# Patient Record
Sex: Male | Born: 1985 | Race: White | Hispanic: No | Marital: Single | Smoking: Current every day smoker
Health system: Southern US, Community
[De-identification: ages and names within clinical notes are randomized; demographics above are authoritative.]

## PROBLEM LIST (undated history)

## (undated) DIAGNOSIS — F909 Attention-deficit hyperactivity disorder, unspecified type: Secondary | ICD-10-CM

## (undated) DIAGNOSIS — F209 Schizophrenia, unspecified: Secondary | ICD-10-CM

## (undated) DIAGNOSIS — F988 Other specified behavioral and emotional disorders with onset usually occurring in childhood and adolescence: Secondary | ICD-10-CM

## (undated) DIAGNOSIS — F319 Bipolar disorder, unspecified: Secondary | ICD-10-CM

## (undated) HISTORY — PX: APPENDECTOMY: SHX54

## (undated) HISTORY — PX: OTHER SURGICAL HISTORY: SHX169

---
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
## 2007-12-02 ENCOUNTER — Emergency Department (HOSPITAL_COMMUNITY): Admission: EM | Admit: 2007-12-02 | Discharge: 2007-12-02 | Payer: Self-pay | Admitting: Family Medicine

## 2014-02-25 ENCOUNTER — Encounter (HOSPITAL_COMMUNITY): Payer: Self-pay | Admitting: Emergency Medicine

## 2014-02-25 ENCOUNTER — Emergency Department (HOSPITAL_COMMUNITY)
Admission: EM | Admit: 2014-02-25 | Discharge: 2014-02-26 | Disposition: A | Discharge status: Home / Self Care | Payer: Self-pay | Attending: Emergency Medicine | Admitting: Emergency Medicine

## 2014-02-25 DIAGNOSIS — R4182 Altered mental status, unspecified: Secondary | ICD-10-CM | POA: Insufficient documentation

## 2014-02-25 DIAGNOSIS — F17200 Nicotine dependence, unspecified, uncomplicated: Secondary | ICD-10-CM | POA: Insufficient documentation

## 2014-02-25 DIAGNOSIS — F1190 Opioid use, unspecified, uncomplicated: Secondary | ICD-10-CM

## 2014-02-25 DIAGNOSIS — F1110 Opioid abuse, uncomplicated: Secondary | ICD-10-CM | POA: Insufficient documentation

## 2014-02-25 HISTORY — DX: Other specified behavioral and emotional disorders with onset usually occurring in childhood and adolescence: F98.8

## 2014-02-25 HISTORY — DX: Bipolar disorder, unspecified: F31.9

## 2014-02-25 HISTORY — DX: Schizophrenia, unspecified: F20.9

## 2014-02-25 HISTORY — DX: Attention-deficit hyperactivity disorder, unspecified type: F90.9

## 2014-02-25 LAB — CBC WITH DIFFERENTIAL/PLATELET
BASOS ABS: 0 10*3/uL (ref 0.0–0.1)
Basophils Relative: 0 % (ref 0–1)
Eosinophils Absolute: 0.2 10*3/uL (ref 0.0–0.7)
Eosinophils Relative: 1 % (ref 0–5)
HCT: 49.3 % (ref 39.0–52.0)
Hemoglobin: 16.6 g/dL (ref 13.0–17.0)
LYMPHS ABS: 7.2 10*3/uL — AB (ref 0.7–4.0)
Lymphocytes Relative: 40 % (ref 12–46)
MCH: 31 pg (ref 26.0–34.0)
MCHC: 33.7 g/dL (ref 30.0–36.0)
MCV: 92.1 fL (ref 78.0–100.0)
MONO ABS: 1.3 10*3/uL — AB (ref 0.1–1.0)
MONOS PCT: 7 % (ref 3–12)
Neutro Abs: 9.3 10*3/uL — ABNORMAL HIGH (ref 1.7–7.7)
Neutrophils Relative %: 52 % (ref 43–77)
Platelets: 302 10*3/uL (ref 150–400)
RBC: 5.35 MIL/uL (ref 4.22–5.81)
RDW: 14.5 % (ref 11.5–15.5)
WBC: 18 10*3/uL — AB (ref 4.0–10.5)

## 2014-02-25 LAB — BASIC METABOLIC PANEL
Anion gap: 19 — ABNORMAL HIGH (ref 5–15)
BUN: 14 mg/dL (ref 6–23)
CO2: 19 mEq/L (ref 19–32)
CREATININE: 0.98 mg/dL (ref 0.50–1.35)
Calcium: 9.1 mg/dL (ref 8.4–10.5)
Chloride: 106 mEq/L (ref 96–112)
Glucose, Bld: 195 mg/dL — ABNORMAL HIGH (ref 70–99)
POTASSIUM: 3.5 meq/L — AB (ref 3.7–5.3)
Sodium: 144 mEq/L (ref 137–147)

## 2014-02-25 LAB — ETHANOL: Alcohol, Ethyl (B): <11 mg/dL (ref 0–11)

## 2014-02-25 LAB — SALICYLATE LEVEL: Salicylate Lvl: <2 mg/dL — ABNORMAL LOW (ref 2.8–20.0)

## 2014-02-25 LAB — ACETAMINOPHEN LEVEL: Acetaminophen (Tylenol), Serum: <15 ug/mL (ref 10–30)

## 2014-02-25 LAB — CBG MONITORING, ED: Glucose-Capillary: 183 mg/dL — ABNORMAL HIGH (ref 70–99)

## 2014-02-25 MED ORDER — SODIUM CHLORIDE 0.9 % IV BOLUS (SEPSIS)
1000.0000 mL | Freq: Once | INTRAVENOUS | Status: AC
  Administered 2014-02-25: 1000 mL via INTRAVENOUS

## 2014-02-25 MED ORDER — NALOXONE HCL 0.4 MG/ML IJ SOLN
INTRAMUSCULAR | Status: AC
  Administered 2014-02-25: 0.4 mg via INTRAVENOUS
  Filled 2014-02-25: qty 2

## 2014-02-25 MED ORDER — NALOXONE HCL 0.4 MG/ML IJ SOLN
0.4000 mg | Freq: Once | INTRAMUSCULAR | Status: AC
  Administered 2014-02-25: 0.4 mg via INTRAVENOUS

## 2014-02-25 MED ORDER — NALOXONE HCL 1 MG/ML IJ SOLN
INTRAMUSCULAR | Status: AC
  Filled 2014-02-25: qty 2

## 2014-02-25 NOTE — Discharge Instructions (Signed)
Do not take pain medications for recreation.  Do not take medications that are not yours.  Do not mix drugs and alcohol

## 2014-02-25 NOTE — ED Notes (Addendum)
Pt was found by bystander and brought to ED by bystander. Pt was extricated from car by ED staff. Pt is lethargic a tthis time. Pupils are pinpoint. Pt is admitting to drinking "a few beers.; Pt denies taking any other medications at this time. HR 135, regular.
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
## 2014-02-25 NOTE — ED Notes (Signed)
Pt reports taking loritab 10mg  (5-6 tabs), and oxycodone 10mg  (4-5 tabs), and drinking 8 beers.

## 2014-02-25 NOTE — ED Notes (Signed)
Pt remaining alert and oriented x4, speaking in complete sentences.

## 2014-02-25 NOTE — ED Notes (Signed)
Patient states that he will attempt to give a urine sample at this time.

## 2014-07-07 ENCOUNTER — Emergency Department (HOSPITAL_COMMUNITY): Payer: Self-pay

## 2014-07-07 ENCOUNTER — Encounter (HOSPITAL_COMMUNITY): Payer: Self-pay

## 2014-07-07 ENCOUNTER — Emergency Department (HOSPITAL_COMMUNITY)
Admission: EM | Admit: 2014-07-07 | Discharge: 2014-07-07 | Disposition: A | Discharge status: Home / Self Care | Payer: Self-pay | Attending: Emergency Medicine | Admitting: Emergency Medicine

## 2014-07-07 DIAGNOSIS — R079 Chest pain, unspecified: Secondary | ICD-10-CM

## 2014-07-07 DIAGNOSIS — R0602 Shortness of breath: Secondary | ICD-10-CM

## 2014-07-07 DIAGNOSIS — Z720 Tobacco use: Secondary | ICD-10-CM | POA: Insufficient documentation

## 2014-07-07 DIAGNOSIS — Z8659 Personal history of other mental and behavioral disorders: Secondary | ICD-10-CM | POA: Insufficient documentation

## 2014-07-07 DIAGNOSIS — R0789 Other chest pain: Secondary | ICD-10-CM

## 2014-07-07 LAB — CBC WITH DIFFERENTIAL/PLATELET
Basophils Absolute: 0 10*3/uL (ref 0.0–0.1)
Basophils Relative: 0 % (ref 0–1)
EOS PCT: 0 % (ref 0–5)
Eosinophils Absolute: 0 10*3/uL (ref 0.0–0.7)
HCT: 40.7 % (ref 39.0–52.0)
HEMOGLOBIN: 13.8 g/dL (ref 13.0–17.0)
LYMPHS ABS: 1.3 10*3/uL (ref 0.7–4.0)
Lymphocytes Relative: 10 % — ABNORMAL LOW (ref 12–46)
MCH: 29.7 pg (ref 26.0–34.0)
MCHC: 33.9 g/dL (ref 30.0–36.0)
MCV: 87.7 fL (ref 78.0–100.0)
MONOS PCT: 9 % (ref 3–12)
Monocytes Absolute: 1.2 10*3/uL — ABNORMAL HIGH (ref 0.1–1.0)
Neutro Abs: 10.9 10*3/uL — ABNORMAL HIGH (ref 1.7–7.7)
Neutrophils Relative %: 81 % — ABNORMAL HIGH (ref 43–77)
Platelets: 211 10*3/uL (ref 150–400)
RBC: 4.64 MIL/uL (ref 4.22–5.81)
RDW: 13.1 % (ref 11.5–15.5)
WBC: 13.3 10*3/uL — ABNORMAL HIGH (ref 4.0–10.5)

## 2014-07-07 LAB — COMPREHENSIVE METABOLIC PANEL
ALK PHOS: 59 U/L (ref 39–117)
ALT: 19 U/L (ref 0–53)
AST: 16 U/L (ref 0–37)
Albumin: 3.2 g/dL — ABNORMAL LOW (ref 3.5–5.2)
Anion gap: 17 — ABNORMAL HIGH (ref 5–15)
BUN: 6 mg/dL (ref 6–23)
CALCIUM: 8.8 mg/dL (ref 8.4–10.5)
CO2: 20 mEq/L (ref 19–32)
Chloride: 99 mEq/L (ref 96–112)
Creatinine, Ser: 0.66 mg/dL (ref 0.50–1.35)
GLUCOSE: 115 mg/dL — AB (ref 70–99)
POTASSIUM: 3.4 meq/L — AB (ref 3.7–5.3)
Sodium: 136 mEq/L — ABNORMAL LOW (ref 137–147)
TOTAL PROTEIN: 7.4 g/dL (ref 6.0–8.3)
Total Bilirubin: 0.5 mg/dL (ref 0.3–1.2)

## 2014-07-07 MED ORDER — IBUPROFEN 600 MG PO TABS: 600.0000 mg | ORAL_TABLET | Freq: Four times a day (QID) | ORAL | Status: AC | PRN

## 2014-07-07 MED ORDER — IPRATROPIUM-ALBUTEROL 0.5-2.5 (3) MG/3ML IN SOLN
3.0000 mL | Freq: Once | RESPIRATORY_TRACT | Status: AC
  Administered 2014-07-07: 3 mL via RESPIRATORY_TRACT

## 2014-07-07 MED ORDER — KETOROLAC TROMETHAMINE 60 MG/2ML IM SOLN
60.0000 mg | Freq: Once | INTRAMUSCULAR | Status: AC
  Administered 2014-07-07: 60 mg via INTRAMUSCULAR
  Filled 2014-07-07: qty 2

## 2014-07-07 MED ORDER — IOHEXOL 350 MG/ML SOLN
100.0000 mL | Freq: Once | INTRAVENOUS | Status: AC | PRN
  Administered 2014-07-07: 100 mL via INTRAVENOUS

## 2014-07-07 NOTE — ED Notes (Signed)
Pt from home with shortness of breath that he reports started tonight; localizes pain to right side of chest.  Pt tachycardic in triage with HR in the 130's initially, currently 118 while EKG being performed.  Pt tachypneic not taking deep breaths during assessment; breath sounds auscultated in triage.

## 2014-07-07 NOTE — ED Notes (Signed)
Patient transported to CT 
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
## 2014-07-07 NOTE — Discharge Instructions (Signed)

## 2014-08-08 ENCOUNTER — Emergency Department (HOSPITAL_COMMUNITY)
Admission: EM | Admit: 2014-08-08 | Discharge: 2014-08-08 | Disposition: A | Discharge status: Home / Self Care | Payer: Self-pay | Attending: Emergency Medicine | Admitting: Emergency Medicine

## 2014-08-08 DIAGNOSIS — Z720 Tobacco use: Secondary | ICD-10-CM | POA: Insufficient documentation

## 2014-08-08 DIAGNOSIS — F1110 Opioid abuse, uncomplicated: Secondary | ICD-10-CM | POA: Insufficient documentation
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
## 2015-01-02 ENCOUNTER — Emergency Department
Admission: EM | Admit: 2015-01-02 | Discharge: 2015-01-02 | Disposition: A | Discharge status: Home / Self Care | Payer: Self-pay | Attending: Emergency Medicine | Admitting: Emergency Medicine

## 2015-01-02 ENCOUNTER — Encounter: Payer: Self-pay | Admitting: Emergency Medicine

## 2015-01-02 ENCOUNTER — Emergency Department: Payer: Self-pay

## 2015-01-02 DIAGNOSIS — Z720 Tobacco use: Secondary | ICD-10-CM | POA: Insufficient documentation

## 2015-01-02 DIAGNOSIS — Z792 Long term (current) use of antibiotics: Secondary | ICD-10-CM | POA: Insufficient documentation

## 2015-01-02 DIAGNOSIS — S60222A Contusion of left hand, initial encounter: Secondary | ICD-10-CM | POA: Insufficient documentation

## 2015-01-02 DIAGNOSIS — Y9389 Activity, other specified: Secondary | ICD-10-CM | POA: Insufficient documentation

## 2015-01-02 DIAGNOSIS — Y998 Other external cause status: Secondary | ICD-10-CM | POA: Insufficient documentation

## 2015-01-02 DIAGNOSIS — Y9289 Other specified places as the place of occurrence of the external cause: Secondary | ICD-10-CM | POA: Insufficient documentation

## 2015-01-02 DIAGNOSIS — W231XXA Caught, crushed, jammed, or pinched between stationary objects, initial encounter: Secondary | ICD-10-CM | POA: Insufficient documentation

## 2015-01-02 DIAGNOSIS — Z7982 Long term (current) use of aspirin: Secondary | ICD-10-CM | POA: Insufficient documentation

## 2015-01-02 MED ORDER — KETOROLAC TROMETHAMINE 30 MG/ML IJ SOLN
INTRAMUSCULAR | Status: AC
  Administered 2015-01-02: 60 mg via INTRAMUSCULAR
  Filled 2015-01-02: qty 1

## 2015-01-02 MED ORDER — HYDROCODONE-ACETAMINOPHEN 5-325 MG PO TABS: 2.0000 | ORAL_TABLET | Freq: Once | ORAL | Status: DC

## 2015-01-02 MED ORDER — SULFAMETHOXAZOLE-TRIMETHOPRIM 800-160 MG PO TABS: 1.0000 | ORAL_TABLET | Freq: Two times a day (BID) | ORAL | Status: AC

## 2015-01-02 MED ORDER — OXYCODONE HCL 10 MG PO TABS: 10.0000 mg | ORAL_TABLET | Freq: Four times a day (QID) | ORAL | Status: AC | PRN

## 2015-01-02 MED ORDER — KETOROLAC TROMETHAMINE 30 MG/ML IJ SOLN
INTRAMUSCULAR | Status: AC
  Filled 2015-01-02: qty 1

## 2015-01-02 MED ORDER — KETOROLAC TROMETHAMINE 30 MG/ML IJ SOLN
60.0000 mg | Freq: Once | INTRAMUSCULAR | Status: AC
  Administered 2015-01-02: 60 mg via INTRAMUSCULAR

## 2015-01-02 MED ORDER — HYDROCODONE-ACETAMINOPHEN 5-325 MG PO TABS
ORAL_TABLET | ORAL | Status: AC
  Filled 2015-01-02: qty 2

## 2015-01-02 NOTE — Discharge Instructions (Signed)
Hand Contusion °A hand contusion is a deep bruise on your hand area. Contusions are the result of an injury that caused bleeding under the skin. The contusion may turn blue, purple, or yellow. Minor injuries will give you a painless contusion, but more severe contusions may stay painful and swollen for a few weeks. °CAUSES  °A contusion is usually caused by a blow, trauma, or direct force to an area of the body. °SYMPTOMS  °· Swelling and redness of the injured area. °· Discoloration of the injured area. °· Tenderness and soreness of the injured area. °· Pain. °DIAGNOSIS  °The diagnosis can be made by taking a history and performing a physical exam. An X-ray, CT scan, or MRI may be needed to determine if there were any associated injuries, such as broken bones (fractures). °TREATMENT  °Often, the best treatment for a hand contusion is resting, elevating, icing, and applying cold compresses to the injured area. Over-the-counter medicines may also be recommended for pain control. °HOME CARE INSTRUCTIONS  °· Put ice on the injured area. °¨ Put ice in a plastic bag. °¨ Place a towel between your skin and the bag. °¨ Leave the ice on for 15-20 minutes, 03-04 times a day. °· Only take over-the-counter or prescription medicines as directed by your caregiver. Your caregiver may recommend avoiding anti-inflammatory medicines (aspirin, ibuprofen, and naproxen) for 48 hours because these medicines may increase bruising. °· If told, use an elastic wrap as directed. This can help reduce swelling. You may remove the wrap for sleeping, showering, and bathing. If your fingers become numb, cold, or blue, take the wrap off and reapply it more loosely. °· Elevate your hand with pillows to reduce swelling. °· Avoid overusing your hand if it is painful. °SEEK IMMEDIATE MEDICAL CARE IF:  °· You have increased redness, swelling, or pain in your hand. °· Your swelling or pain is not relieved with medicines. °· You have loss of feeling in  your hand or are unable to move your fingers. °· Your hand turns cold or blue. °· You have pain when you move your fingers. °· Your hand becomes warm to the touch. °· Your contusion does not improve in 2 days. °MAKE SURE YOU:  °· Understand these instructions. °· Will watch your condition. °· Will get help right away if you are not doing well or get worse. °Document Released: 01/05/2002 Document Revised: 04/09/2012 Document Reviewed: 01/07/2012 °[employer]® Patient Information ©2015 [employer]. This information is not intended to replace advice given to you by your health care provider. Make sure you discuss any questions you have with your health care provider. ° °

## 2015-01-02 NOTE — ED Notes (Signed)
D/c instructions reviewed w/ pt - pt denies any further questions or concerns at present.  Pt instructed to not use alcohol, drive, or operate heavy machinery while take the prescription pain medications as they could make him drowsy - pt verbalized understanding.   
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
## 2015-01-04 ENCOUNTER — Encounter: Payer: Self-pay | Admitting: Emergency Medicine

## 2015-01-04 ENCOUNTER — Emergency Department
Admission: EM | Admit: 2015-01-04 | Discharge: 2015-01-04 | Discharge status: Against Medical Advice | Payer: Self-pay | Attending: Emergency Medicine | Admitting: Emergency Medicine

## 2015-01-04 DIAGNOSIS — Z09 Encounter for follow-up examination after completed treatment for conditions other than malignant neoplasm: Secondary | ICD-10-CM | POA: Insufficient documentation

## 2015-01-04 DIAGNOSIS — Z720 Tobacco use: Secondary | ICD-10-CM | POA: Insufficient documentation

## 2015-01-04 NOTE — ED Notes (Signed)
Pt arrived to the ED for aq follow-up for an infection on his hand. Pt was seen here in the ED yesterday for an infected abscess on his left hand. Pt is AOx4 in no apparent distress.

## 2016-03-28 IMAGING — CR DG HAND COMPLETE 3+V*L*
1 series · 3 of 3 positions shown · non-contrast
Comparison: None.

CLINICAL DATA: Crush injury of the hand 2 days ago. Swelling and
infection. Abscess.

EXAM:
LEFT HAND - COMPLETE 3+ VIEW

[Series 1: pa · 0.17mm/px · 3 of 3 slices shown]
[im 1/3]
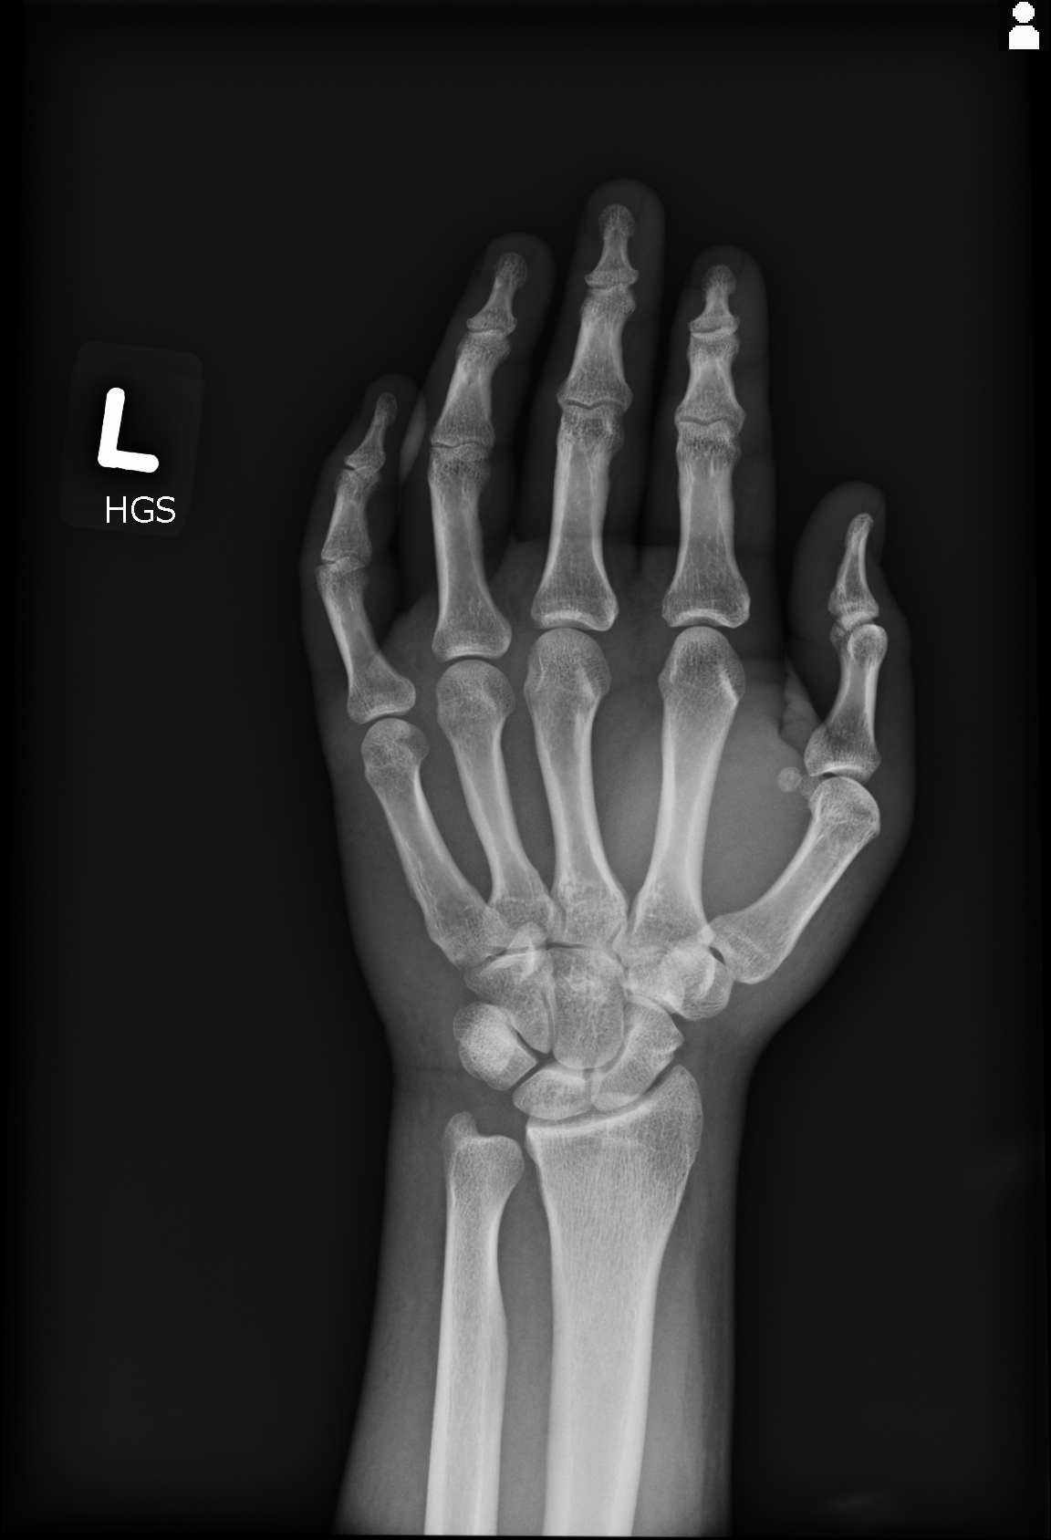
[im 2/3]
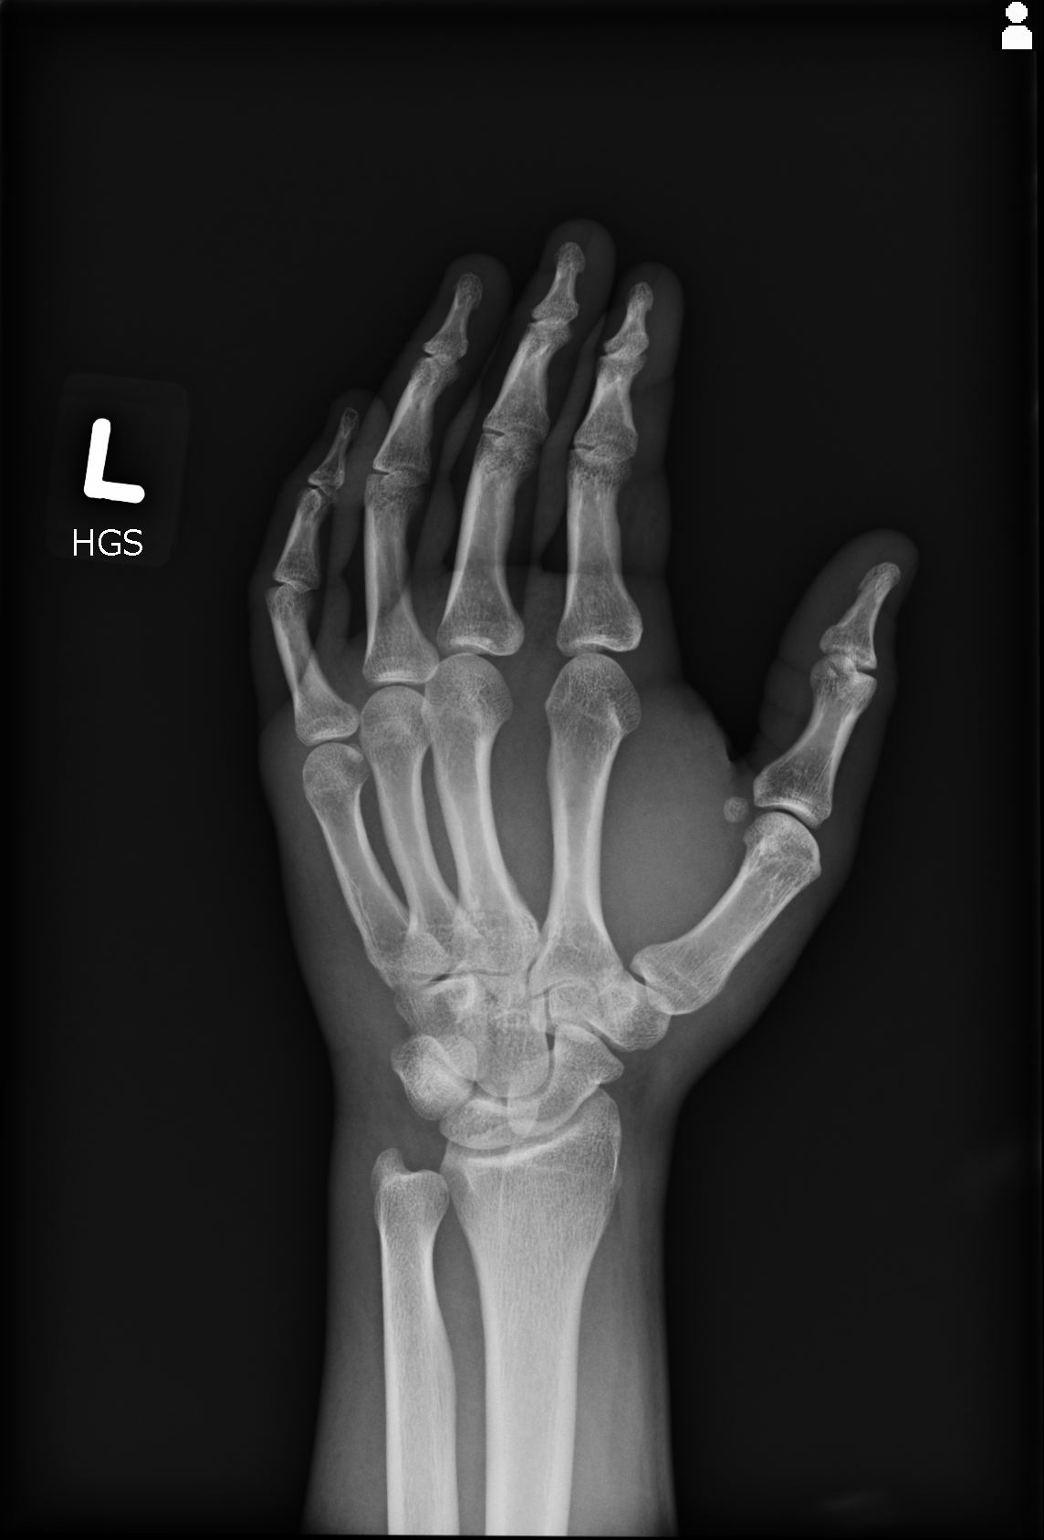
[im 3/3]
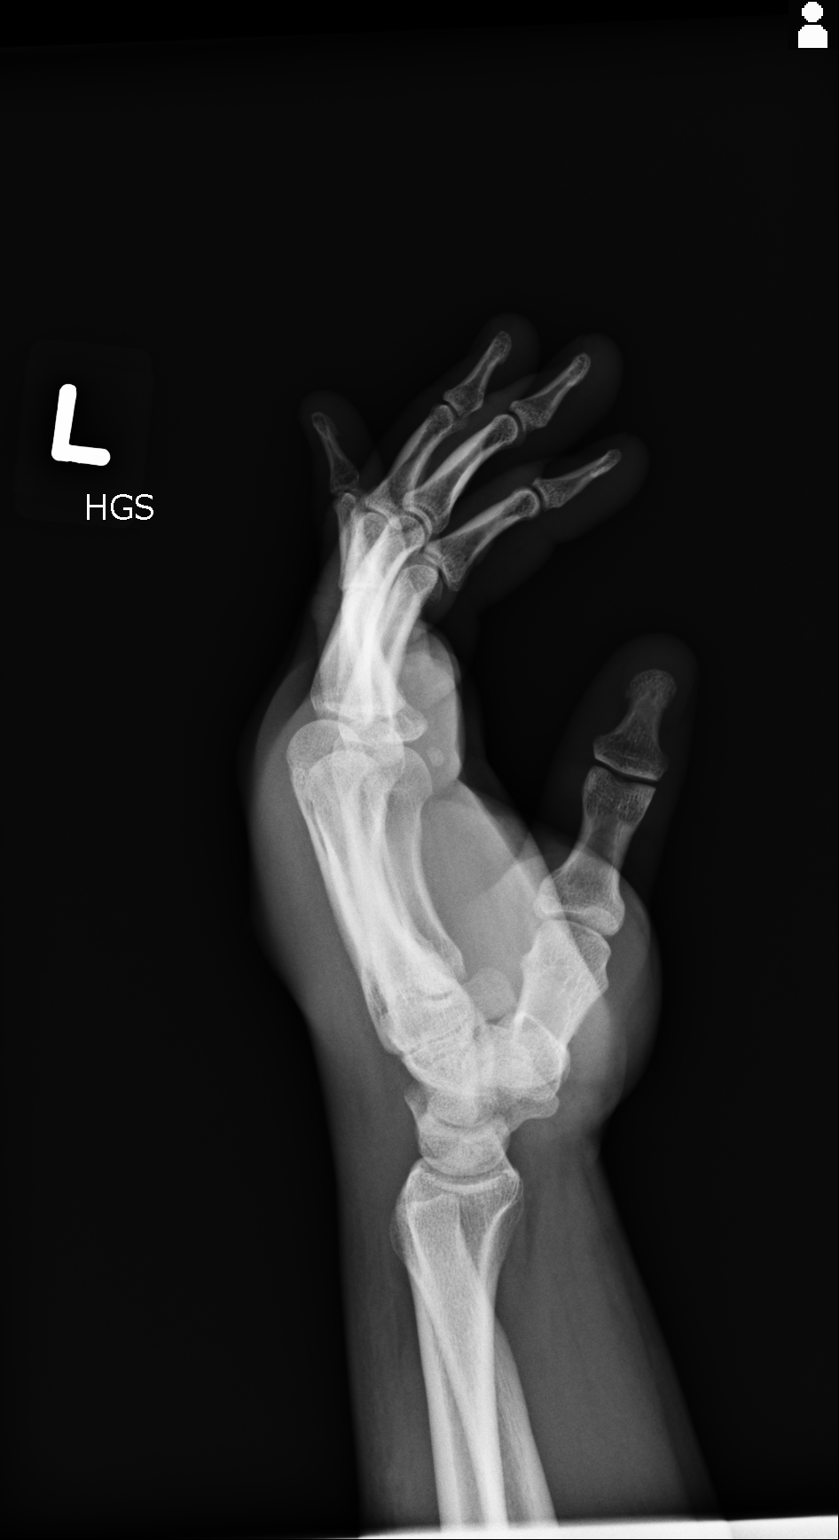

[3 of 3 positions shown; findings below may reference images not displayed]

FINDINGS: There is no fracture or dislocation or radiodense foreign body.
There is dorsal soft tissue swelling over the metacarpals.
IMPRESSION: No acute osseous abnormality.  Dorsal soft tissue swelling.
